# Patient Record
Sex: Male | Born: 1955 | Race: Black or African American | Hispanic: No | Marital: Married | Smoking: Current every day smoker
Health system: Southern US, Community
[De-identification: ages and names within clinical notes are randomized; demographics above are authoritative.]

## PROBLEM LIST (undated history)

## (undated) DIAGNOSIS — K219 Gastro-esophageal reflux disease without esophagitis: Secondary | ICD-10-CM

## (undated) DIAGNOSIS — I10 Essential (primary) hypertension: Secondary | ICD-10-CM

## (undated) HISTORY — PX: KNEE SURGERY: SHX244

---

## 2006-05-11 ENCOUNTER — Emergency Department: Payer: Self-pay | Admitting: Emergency Medicine

## 2011-02-06 ENCOUNTER — Emergency Department (HOSPITAL_COMMUNITY)
Admission: EM | Admit: 2011-02-06 | Discharge: 2011-02-06 | Disposition: A | Discharge status: Home / Self Care | Payer: No Typology Code available for payment source | Attending: Emergency Medicine | Admitting: Emergency Medicine

## 2011-02-06 ENCOUNTER — Encounter (HOSPITAL_COMMUNITY): Payer: Self-pay | Admitting: *Deleted

## 2011-02-06 DIAGNOSIS — S8001XA Contusion of right knee, initial encounter: Secondary | ICD-10-CM

## 2011-02-06 DIAGNOSIS — M810 Age-related osteoporosis without current pathological fracture: Secondary | ICD-10-CM | POA: Insufficient documentation

## 2011-02-06 DIAGNOSIS — S8000XA Contusion of unspecified knee, initial encounter: Secondary | ICD-10-CM | POA: Insufficient documentation

## 2011-02-06 DIAGNOSIS — K219 Gastro-esophageal reflux disease without esophagitis: Secondary | ICD-10-CM | POA: Insufficient documentation

## 2011-02-06 DIAGNOSIS — I10 Essential (primary) hypertension: Secondary | ICD-10-CM | POA: Insufficient documentation

## 2011-02-06 DIAGNOSIS — M25569 Pain in unspecified knee: Secondary | ICD-10-CM | POA: Insufficient documentation

## 2011-02-06 HISTORY — DX: Gastro-esophageal reflux disease without esophagitis: K21.9

## 2011-02-06 HISTORY — DX: Essential (primary) hypertension: I10

## 2011-02-06 MED ORDER — IBUPROFEN 800 MG PO TABS: 800.0000 mg | ORAL_TABLET | Freq: Four times a day (QID) | ORAL | Status: AC | PRN
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
## 2011-02-07 NOTE — ED Provider Notes (Signed)
Medical screening examination/treatment/procedure(s) were performed by non-physician practitioner and as supervising physician I was immediately available for consultation/collaboration.  Flint Melter, MD 02/07/11 1029

## 2017-12-09 DIAGNOSIS — S83104A Unspecified dislocation of right knee, initial encounter: Secondary | ICD-10-CM | POA: Insufficient documentation

## 2017-12-09 DIAGNOSIS — S83241A Other tear of medial meniscus, current injury, right knee, initial encounter: Secondary | ICD-10-CM | POA: Insufficient documentation

## 2018-09-14 DIAGNOSIS — N401 Enlarged prostate with lower urinary tract symptoms: Secondary | ICD-10-CM | POA: Insufficient documentation

## 2018-09-14 DIAGNOSIS — R3129 Other microscopic hematuria: Secondary | ICD-10-CM | POA: Insufficient documentation

## 2019-02-24 ENCOUNTER — Emergency Department
Admission: EM | Admit: 2019-02-24 | Discharge: 2019-02-24 | Disposition: A | Discharge status: Home / Self Care | Attending: Emergency Medicine | Admitting: Emergency Medicine

## 2019-02-24 ENCOUNTER — Other Ambulatory Visit: Payer: Self-pay

## 2019-02-24 ENCOUNTER — Encounter: Payer: Self-pay | Admitting: Emergency Medicine

## 2019-02-24 ENCOUNTER — Emergency Department

## 2019-02-24 DIAGNOSIS — R0602 Shortness of breath: Secondary | ICD-10-CM | POA: Diagnosis not present

## 2019-02-24 DIAGNOSIS — I10 Essential (primary) hypertension: Secondary | ICD-10-CM | POA: Diagnosis not present

## 2019-02-24 DIAGNOSIS — R0789 Other chest pain: Secondary | ICD-10-CM | POA: Diagnosis not present

## 2019-02-24 DIAGNOSIS — Z886 Allergy status to analgesic agent status: Secondary | ICD-10-CM | POA: Insufficient documentation

## 2019-02-24 DIAGNOSIS — R079 Chest pain, unspecified: Secondary | ICD-10-CM

## 2019-02-24 DIAGNOSIS — Z79899 Other long term (current) drug therapy: Secondary | ICD-10-CM | POA: Insufficient documentation

## 2019-02-24 DIAGNOSIS — F17210 Nicotine dependence, cigarettes, uncomplicated: Secondary | ICD-10-CM | POA: Insufficient documentation

## 2019-02-24 LAB — CBC WITH DIFFERENTIAL/PLATELET
Abs Immature Granulocytes: 0.04 10*3/uL (ref 0.00–0.07)
Basophils Absolute: 0.1 10*3/uL (ref 0.0–0.1)
Basophils Relative: 1 %
Eosinophils Absolute: 0.1 10*3/uL (ref 0.0–0.5)
Eosinophils Relative: 1 %
HCT: 44.5 % (ref 39.0–52.0)
Hemoglobin: 14.9 g/dL (ref 13.0–17.0)
Immature Granulocytes: 1 %
Lymphocytes Relative: 25 %
Lymphs Abs: 2 10*3/uL (ref 0.7–4.0)
MCH: 29.2 pg (ref 26.0–34.0)
MCHC: 33.5 g/dL (ref 30.0–36.0)
MCV: 87.1 fL (ref 80.0–100.0)
Monocytes Absolute: 0.5 10*3/uL (ref 0.1–1.0)
Monocytes Relative: 6 %
Neutro Abs: 5.4 10*3/uL (ref 1.7–7.7)
Neutrophils Relative %: 66 %
Platelets: 285 10*3/uL (ref 150–400)
RBC: 5.11 MIL/uL (ref 4.22–5.81)
RDW: 14.8 % (ref 11.5–15.5)
WBC: 8 10*3/uL (ref 4.0–10.5)
nRBC: 0 % (ref 0.0–0.2)

## 2019-02-24 LAB — BASIC METABOLIC PANEL
Anion gap: 11 (ref 5–15)
BUN: 24 mg/dL — ABNORMAL HIGH (ref 8–23)
CO2: 23 mmol/L (ref 22–32)
Calcium: 8.8 mg/dL — ABNORMAL LOW (ref 8.9–10.3)
Chloride: 105 mmol/L (ref 98–111)
Creatinine, Ser: 1.12 mg/dL (ref 0.61–1.24)
GFR calc Af Amer: >60 mL/min (ref >60)
GFR calc non Af Amer: >60 mL/min (ref >60)
Glucose, Bld: 257 mg/dL — ABNORMAL HIGH (ref 70–99)
Potassium: 4 mmol/L (ref 3.5–5.1)
Sodium: 139 mmol/L (ref 135–145)

## 2019-02-24 LAB — BRAIN NATRIURETIC PEPTIDE: B Natriuretic Peptide: 28 pg/mL (ref 0.0–100.0)

## 2019-02-24 LAB — TROPONIN I (HIGH SENSITIVITY)
Troponin I (High Sensitivity): 4 ng/L (ref <18)
Troponin I (High Sensitivity): 5 ng/L (ref <18)

## 2019-02-24 IMAGING — CT CT ANGIO CHEST
2 of 6 series · 19 of 46 positions shown · IV contrast (APPLIED)
Comparison: [DATE]

CLINICAL DATA: Shortness of breath, chest pain

EXAM:
CT ANGIOGRAPHY CHEST WITH CONTRAST
TECHNIQUE: Multidetector CT imaging of the chest was performed using the
standard protocol during bolus administration of intravenous
contrast. Multiplanar CT image reconstructions and MIPs were
obtained to evaluate the vascular anatomy.
CONTRAST:  75mL OMNIPAQUE IOHEXOL 350 MG/ML SOLN

[Series 5: thins · axial · 0.65mm/px · z∈[-343,-70]mm · 16 of 300 slices shown]
[im 14/300  lung]
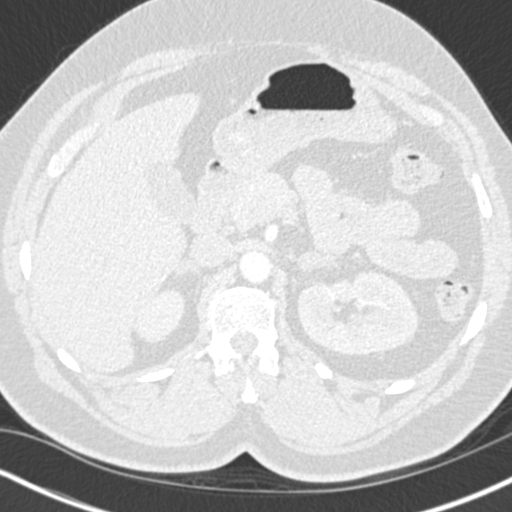
[im 40/300  soft-tissue]
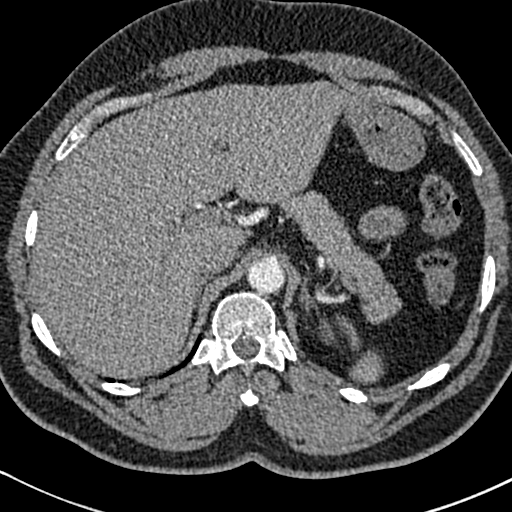
[im 53/300  lung]
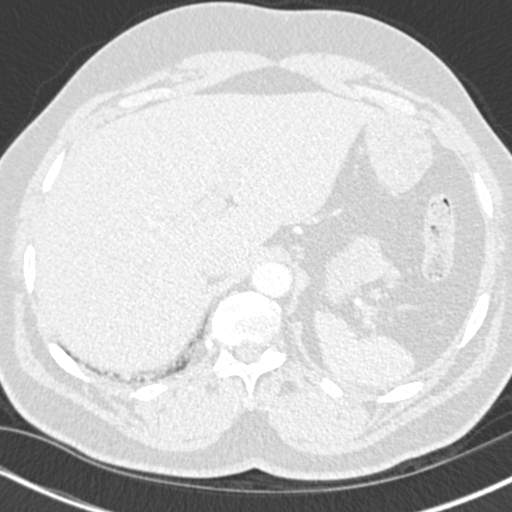
[im 66/300  soft-tissue]
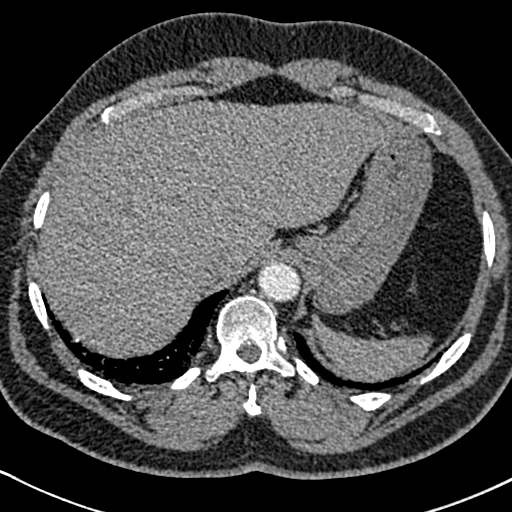
[im 92/300  lung]
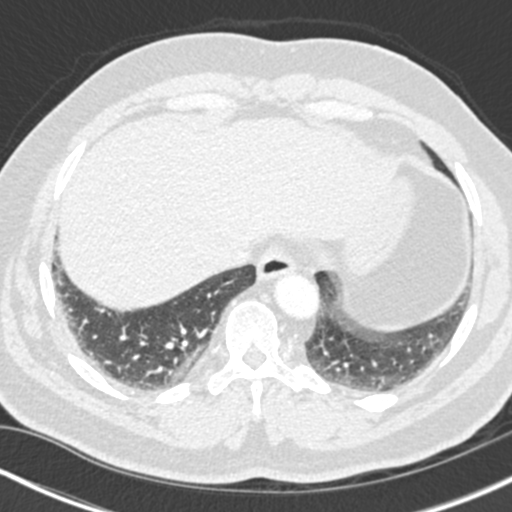
[im 105/300  soft-tissue]
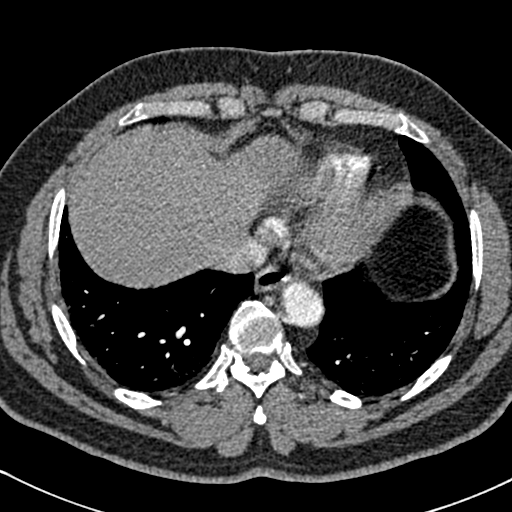
[im 118/300  lung]
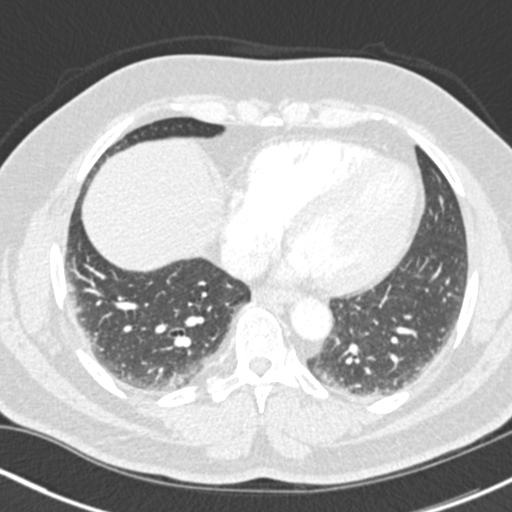
[im 144/300  soft-tissue]
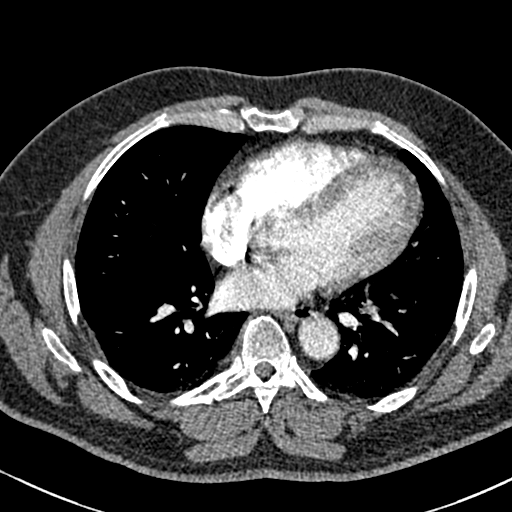
[im 157/300  lung]
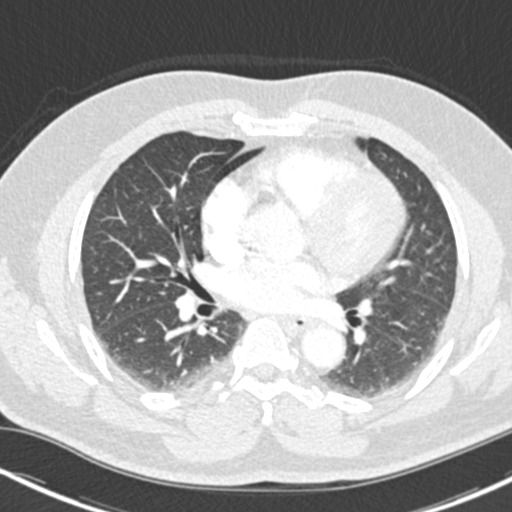
[im 183/300  soft-tissue]
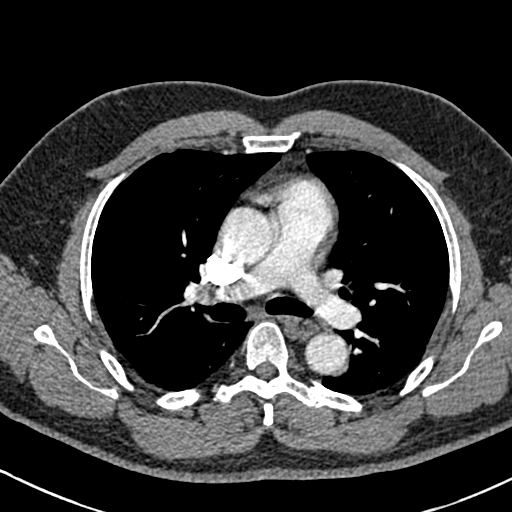
[im 196/300  lung]
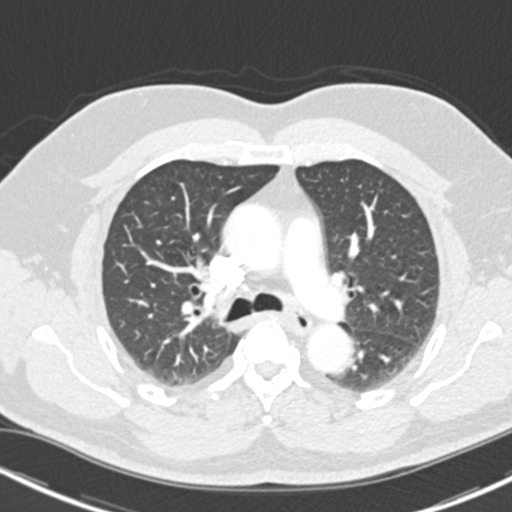
[im 209/300  soft-tissue]
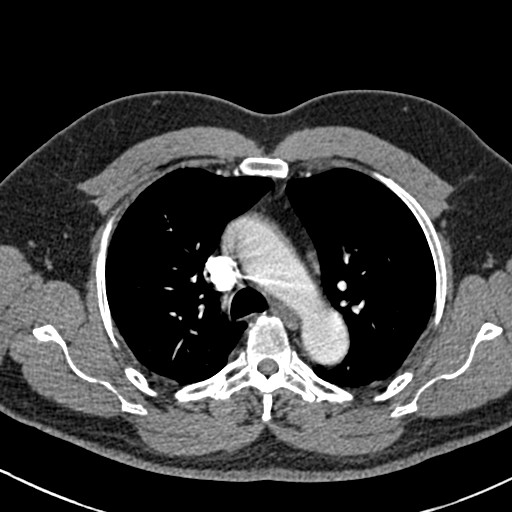
[im 235/300  lung]
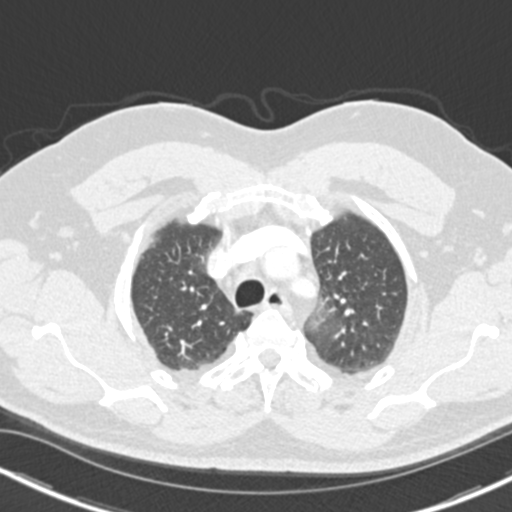
[im 248/300  soft-tissue]
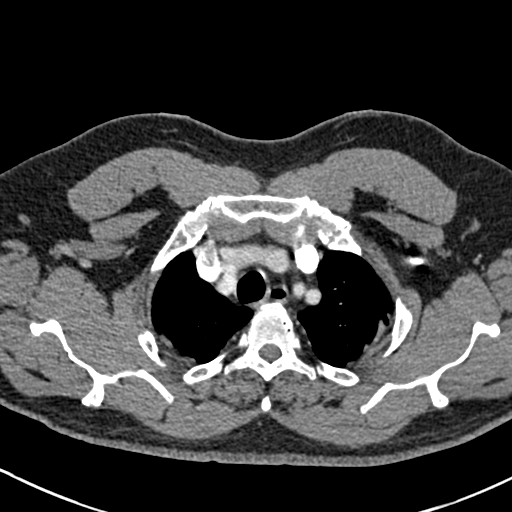
[im 261/300  lung]
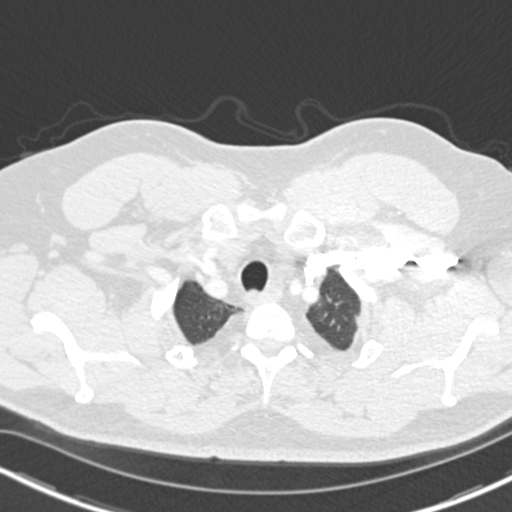
[im 287/300  soft-tissue]
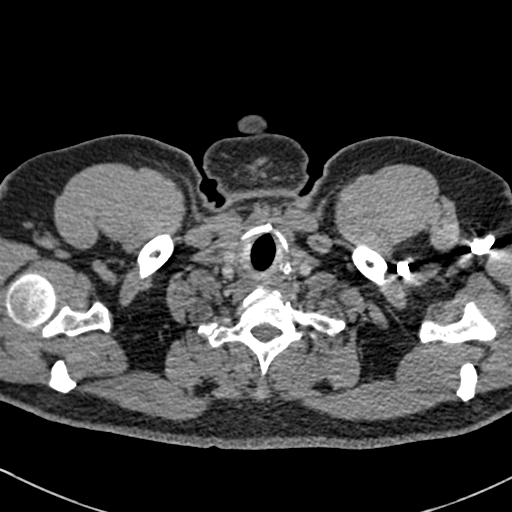

[Series 7: coronal mpr · coronal · 0.64mm/px · 3 of 96 slices shown]
[im 24/96  soft-tissue]
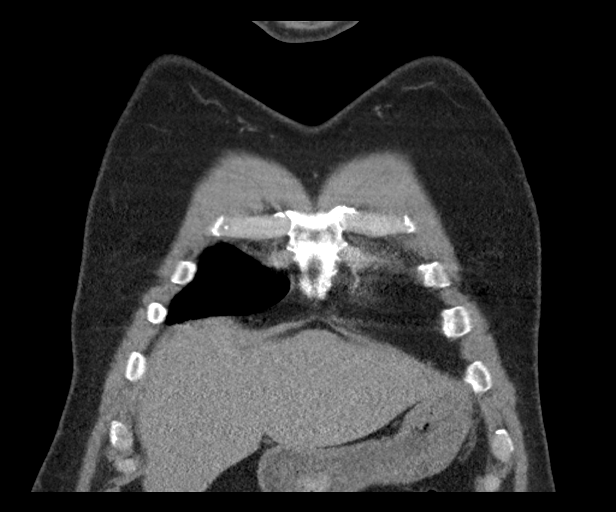
[im 48/96  soft-tissue]
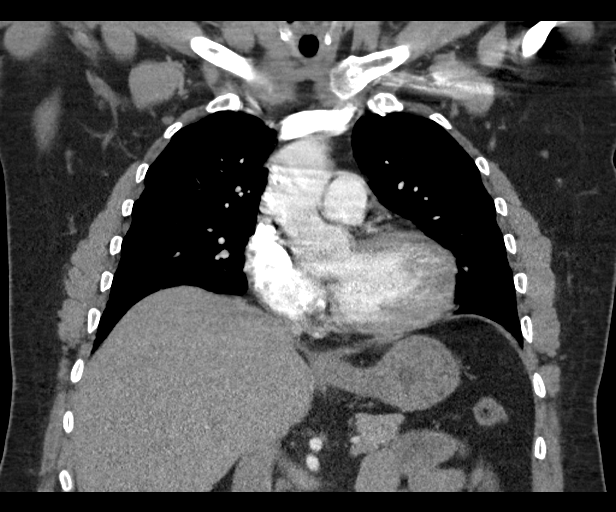
[im 72/96  soft-tissue]
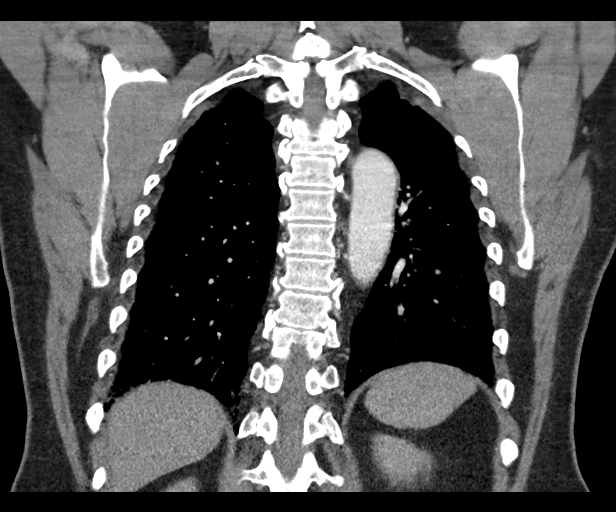

[19 of 46 positions shown; findings below may reference images not displayed]

FINDINGS: Cardiovascular: Satisfactory opacification of the pulmonary arteries
to the segmental level. No evidence of pulmonary embolism. Normal
heart size. No pericardial effusion.

Mediastinum/Nodes: No enlarged mediastinal, hilar, or axillary lymph
nodes. Thyroid gland, trachea, and esophagus demonstrate no
significant findings.

Lungs/Pleura: Lungs are clear. No pleural effusion or pneumothorax.

Upper Abdomen: No acute abnormality.

Musculoskeletal: No chest wall abnormality. No acute or significant
osseous findings.

Review of the MIP images confirms the above findings.
IMPRESSION: Negative examination for pulmonary embolism.

## 2019-02-24 MED ORDER — IOHEXOL 350 MG/ML SOLN
75.0000 mL | Freq: Once | INTRAVENOUS | Status: AC | PRN
  Administered 2019-02-24: 09:00:00 75 mL via INTRAVENOUS

## 2019-02-24 MED ORDER — OMEPRAZOLE 20 MG PO CPDR: 20.0000 mg | DELAYED_RELEASE_CAPSULE | Freq: Every day | ORAL | 1 refills | Status: DC

## 2019-02-24 NOTE — ED Triage Notes (Signed)
C/O chest pain.  Describes pain to center of chest and right chest.  States pain worse with moving right arm.  Had stress test on 1/26.  States since stress test, pain has worsened.

## 2019-02-24 NOTE — ED Provider Notes (Signed)
[facility] Emergency Department Provider Note   ____________________________________________   None    (approximate)  I have reviewed the triage vital signs and the nursing notes.   HISTORY  Chief Complaint Chest Pain    HPI Frank Wise Wilfred Curtis is a 64 y.o. male with past history of hypertension and GERD who presents to the ED complaining of chest pain.  Patient reports that he has been dealing with pain in the center of his chest off and on for about the past 10 years.  He describes it as sharp and not exacerbated or alleviated by anything.  It has become more frequent recently and started up again this morning around 3 AM.  He describes some mild shortness of breath when going upstairs, but has not noticed any fevers, cough, pain or swelling in his legs.  He states he has been following with his PCP at the [facility] for this problem and had a stress test performed 3 days ago.  He called his doctor's office yesterday and was told over the phone that the results of the stress test were normal.  He has been referred to cardiology, but has not yet been seen by them.  He states he was told by his doctor that he "might have an embolus".  He describes pain as mild currently, has not taken anything for it yet today.        Past Medical History:  Diagnosis Date  . Acid reflux   . Hypertension   . Osteoporosis     There are no problems to display for this patient.   Past Surgical History:  Procedure Laterality Date  . KNEE SURGERY     Right    Prior to Admission medications   Medication Sig Start Date End Date Taking? Authorizing Provider  atenolol (TENORMIN) 25 MG tablet Take 25 mg by mouth daily.    [provider]  citalopram (CELEXA) 20 MG tablet Take 20 mg by mouth daily.    [provider]  omeprazole (PRILOSEC) 20 MG capsule Take 1 capsule (20 mg total) by mouth daily. 02/24/19 04/25/19  Blake Divine, MD    Allergies Celebrex  [celecoxib]  Family History  Problem Relation Age of Onset  . Hypertension Father   . Diabetes Sister     Social History Social History   Tobacco Use  . Smoking status: Current Every Day Smoker    Packs/day: 1.00  . Smokeless tobacco: Never Used  Substance Use Topics  . Alcohol use: No  . Drug use: No    Review of Systems  Constitutional: No fever/chills Eyes: No visual changes. ENT: No sore throat. Cardiovascular: Positive for chest pain. Respiratory: Positive for shortness of breath. Gastrointestinal: No abdominal pain.  No nausea, no vomiting.  No diarrhea.  No constipation. Genitourinary: Negative for dysuria. Musculoskeletal: Negative for back pain. Skin: Negative for rash. Neurological: Negative for headaches, focal weakness or numbness.  ____________________________________________   PHYSICAL EXAM:  VITAL SIGNS: ED Triage Vitals  Enc Vitals Group     BP 02/24/19 0758 (!) 153/86     Pulse Rate 02/24/19 0758 94     Resp 02/24/19 0758 (!) 22     Temp 02/24/19 0758 98.6 F (37 C)     Temp Source 02/24/19 0758 Oral     SpO2 02/24/19 0758 95 %     Weight 02/24/19 0751 192 lb 0.3 oz (87.1 kg)     Height --  Head Circumference --      Peak Flow --      Pain Score 02/24/19 0750 4     Pain Loc --      Pain Edu? --      Excl. in [place]? --     Constitutional: Alert and oriented. Eyes: Conjunctivae are normal. Head: Atraumatic. Nose: No congestion/rhinnorhea. Mouth/Throat: Mucous membranes are moist. Neck: Normal ROM Cardiovascular: Normal rate, regular rhythm. Grossly normal heart sounds. Respiratory: Normal respiratory effort.  No retractions. Lungs CTAB.  No chest wall tenderness. Gastrointestinal: Soft and nontender. No distention. Genitourinary: deferred Musculoskeletal: No lower extremity tenderness nor edema. Neurologic:  Normal speech and language. No gross focal neurologic deficits are appreciated. Skin:  Skin is warm, dry and intact. No rash  noted. Psychiatric: Mood and affect are normal. Speech and behavior are normal.  ____________________________________________   LABS (all labs ordered are listed, but only abnormal results are displayed)  Labs Reviewed  BASIC METABOLIC PANEL - Abnormal; Notable for the following components:      Result Value   Glucose, Bld 257 (*)    BUN 24 (*)    Calcium 8.8 (*)    All other components within normal limits  CBC WITH DIFFERENTIAL/PLATELET  BRAIN NATRIURETIC PEPTIDE  TROPONIN I (HIGH SENSITIVITY)  TROPONIN I (HIGH SENSITIVITY)   ____________________________________________  EKG  ED ECG REPORT I, Blake Divine, the attending physician, personally viewed and interpreted this ECG.   Date: 02/24/2019  EKG Time: 7:55  Rate: 96  Rhythm: normal sinus rhythm  Axis: Normal  Intervals:none  ST&T Change: None   PROCEDURES  Procedure(s) performed (including Critical Care):  Procedures   ____________________________________________   INITIAL IMPRESSION / ASSESSMENT AND PLAN / ED COURSE       64 year old male with history of hypertension and GERD presents to the ED complaining of intermittent chest pain for years, acutely worse since early this morning.  He did have a stress test earlier this week that he reports is negative and symptoms sound very atypical for ACS.  Initial EKG without acute ischemic changes, will check 2 sets of troponin and if this is negative I doubt ACS.  We will also assess for PE with CTA of chest.  Doubt dissection and no clinical signs of heart failure.  If work-up is unremarkable, suspect musculoskeletal etiology versus GERD and patient would be appropriate for outpatient follow-up with his PCP as well as cardiology.  CTA negative for PE or other acute process, 2 sets of troponin within normal limits.  Will start patient on omeprazole for potential GERD and I have counseled him to follow-up with his PCP at the [facility] as well as cardiology as previously  scheduled.  Counseled him to return to the ED for new or worsening symptoms, patient agrees with plan.      ____________________________________________   FINAL CLINICAL IMPRESSION(S) / ED DIAGNOSES  Final diagnoses:  Chest pain, unspecified type     ED Discharge Orders         Ordered    omeprazole (PRILOSEC) 20 MG capsule  Daily     02/24/19 1144           Note:  This document was prepared using Dragon voice recognition software and may include unintentional dictation errors.   Blake Divine, MD 02/24/19 [phone]

## 2020-09-04 DIAGNOSIS — G4719 Other hypersomnia: Secondary | ICD-10-CM | POA: Insufficient documentation

## 2020-10-21 ENCOUNTER — Other Ambulatory Visit: Payer: Self-pay

## 2020-10-21 ENCOUNTER — Encounter: Payer: Self-pay | Admitting: Podiatry

## 2020-10-21 ENCOUNTER — Ambulatory Visit (INDEPENDENT_AMBULATORY_CARE_PROVIDER_SITE_OTHER): Admitting: Podiatry

## 2020-10-21 DIAGNOSIS — R519 Headache, unspecified: Secondary | ICD-10-CM | POA: Insufficient documentation

## 2020-10-21 DIAGNOSIS — M79676 Pain in unspecified toe(s): Secondary | ICD-10-CM | POA: Diagnosis not present

## 2020-10-21 DIAGNOSIS — M2550 Pain in unspecified joint: Secondary | ICD-10-CM | POA: Insufficient documentation

## 2020-10-21 DIAGNOSIS — F331 Major depressive disorder, recurrent, moderate: Secondary | ICD-10-CM | POA: Insufficient documentation

## 2020-10-21 DIAGNOSIS — K635 Polyp of colon: Secondary | ICD-10-CM | POA: Insufficient documentation

## 2020-10-21 DIAGNOSIS — D2370 Other benign neoplasm of skin of unspecified lower limb, including hip: Secondary | ICD-10-CM

## 2020-10-21 DIAGNOSIS — R0789 Other chest pain: Secondary | ICD-10-CM | POA: Insufficient documentation

## 2020-10-21 DIAGNOSIS — G4730 Sleep apnea, unspecified: Secondary | ICD-10-CM | POA: Insufficient documentation

## 2020-10-21 DIAGNOSIS — G4714 Hypersomnia due to medical condition: Secondary | ICD-10-CM | POA: Insufficient documentation

## 2020-10-21 DIAGNOSIS — F329 Major depressive disorder, single episode, unspecified: Secondary | ICD-10-CM | POA: Insufficient documentation

## 2020-10-21 DIAGNOSIS — G3184 Mild cognitive impairment, so stated: Secondary | ICD-10-CM | POA: Insufficient documentation

## 2020-10-21 DIAGNOSIS — H409 Unspecified glaucoma: Secondary | ICD-10-CM | POA: Insufficient documentation

## 2020-10-21 DIAGNOSIS — Z860100 Personal history of colon polyps, unspecified: Secondary | ICD-10-CM | POA: Insufficient documentation

## 2020-10-21 DIAGNOSIS — R69 Illness, unspecified: Secondary | ICD-10-CM | POA: Insufficient documentation

## 2020-10-21 DIAGNOSIS — F321 Major depressive disorder, single episode, moderate: Secondary | ICD-10-CM | POA: Insufficient documentation

## 2020-10-21 DIAGNOSIS — H903 Sensorineural hearing loss, bilateral: Secondary | ICD-10-CM | POA: Insufficient documentation

## 2020-10-21 DIAGNOSIS — F4321 Adjustment disorder with depressed mood: Secondary | ICD-10-CM | POA: Insufficient documentation

## 2020-10-21 DIAGNOSIS — Z01818 Encounter for other preprocedural examination: Secondary | ICD-10-CM | POA: Insufficient documentation

## 2020-10-21 DIAGNOSIS — B351 Tinea unguium: Secondary | ICD-10-CM | POA: Diagnosis not present

## 2020-10-21 DIAGNOSIS — L603 Nail dystrophy: Secondary | ICD-10-CM | POA: Diagnosis not present

## 2020-10-21 DIAGNOSIS — F32A Depression, unspecified: Secondary | ICD-10-CM | POA: Insufficient documentation

## 2020-10-21 DIAGNOSIS — E119 Type 2 diabetes mellitus without complications: Secondary | ICD-10-CM | POA: Insufficient documentation

## 2020-10-21 DIAGNOSIS — M79643 Pain in unspecified hand: Secondary | ICD-10-CM | POA: Insufficient documentation

## 2020-10-21 DIAGNOSIS — R1013 Epigastric pain: Secondary | ICD-10-CM | POA: Insufficient documentation

## 2020-10-21 DIAGNOSIS — K219 Gastro-esophageal reflux disease without esophagitis: Secondary | ICD-10-CM | POA: Insufficient documentation

## 2020-10-21 DIAGNOSIS — L409 Psoriasis, unspecified: Secondary | ICD-10-CM | POA: Insufficient documentation

## 2020-10-21 DIAGNOSIS — K2930 Chronic superficial gastritis without bleeding: Secondary | ICD-10-CM | POA: Insufficient documentation

## 2020-10-21 DIAGNOSIS — M19042 Primary osteoarthritis, left hand: Secondary | ICD-10-CM | POA: Insufficient documentation

## 2020-10-21 DIAGNOSIS — N399 Disorder of urinary system, unspecified: Secondary | ICD-10-CM | POA: Insufficient documentation

## 2020-10-21 DIAGNOSIS — H401234 Low-tension glaucoma, bilateral, indeterminate stage: Secondary | ICD-10-CM | POA: Insufficient documentation

## 2020-10-21 DIAGNOSIS — G4700 Insomnia, unspecified: Secondary | ICD-10-CM | POA: Insufficient documentation

## 2020-10-21 DIAGNOSIS — H9313 Tinnitus, bilateral: Secondary | ICD-10-CM | POA: Insufficient documentation

## 2020-10-21 DIAGNOSIS — M25569 Pain in unspecified knee: Secondary | ICD-10-CM | POA: Insufficient documentation

## 2020-10-21 DIAGNOSIS — M1990 Unspecified osteoarthritis, unspecified site: Secondary | ICD-10-CM | POA: Insufficient documentation

## 2020-10-21 DIAGNOSIS — Z716 Tobacco abuse counseling: Secondary | ICD-10-CM | POA: Insufficient documentation

## 2020-10-21 DIAGNOSIS — Z9189 Other specified personal risk factors, not elsewhere classified: Secondary | ICD-10-CM | POA: Insufficient documentation

## 2020-10-21 DIAGNOSIS — N529 Male erectile dysfunction, unspecified: Secondary | ICD-10-CM | POA: Insufficient documentation

## 2020-10-21 DIAGNOSIS — E785 Hyperlipidemia, unspecified: Secondary | ICD-10-CM | POA: Insufficient documentation

## 2020-10-21 DIAGNOSIS — L810 Postinflammatory hyperpigmentation: Secondary | ICD-10-CM | POA: Insufficient documentation

## 2020-10-21 DIAGNOSIS — Z461 Encounter for fitting and adjustment of hearing aid: Secondary | ICD-10-CM | POA: Insufficient documentation

## 2020-10-21 DIAGNOSIS — F17200 Nicotine dependence, unspecified, uncomplicated: Secondary | ICD-10-CM | POA: Insufficient documentation

## 2020-10-21 DIAGNOSIS — I10 Essential (primary) hypertension: Secondary | ICD-10-CM | POA: Insufficient documentation

## 2020-10-21 NOTE — Progress Notes (Signed)
Subjective:  Patient ID: Frank Wise, male    DOB: [birth year],  MRN: [id number] HPI Chief Complaint  Patient presents with   Nail Problem    Toenails bilateral - thick and discolored x years, has trouble trimming himself   New Patient (Initial Visit)    65 y.o. male presents with the above complaint.   ROS: Denies fever chills nausea vomiting muscle aches pains calf pain back pain chest pain shortness of breath.  Past Medical History:  Diagnosis Date   Acid reflux    Hypertension    Osteoporosis    Past Surgical History:  Procedure Laterality Date   KNEE SURGERY     Right    Current Outpatient Medications:    aspirin EC 81 MG tablet, Take 81 mg by mouth daily. Swallow whole., Disp: , Rfl:    eszopiclone (LUNESTA) 2 MG TABS tablet, Take 1 tablet by mouth every evening., Disp: , Rfl:    latanoprost (XALATAN) 0.005 % ophthalmic solution, 1 drop at bedtime., Disp: , Rfl:    amLODipine (NORVASC) 10 MG tablet, Take 10 mg by mouth daily., Disp: , Rfl:    atenolol (TENORMIN) 25 MG tablet, Take 25 mg by mouth daily., Disp: , Rfl:    atorvastatin (LIPITOR) 80 MG tablet, , Disp: , Rfl:    buPROPion ER (WELLBUTRIN SR) 100 MG 12 hr tablet, Take 100 mg by mouth 2 (two) times daily., Disp: , Rfl:    lisinopril (ZESTRIL) 20 MG tablet, Take 20 mg by mouth daily., Disp: , Rfl:    loratadine (CLARITIN) 10 MG tablet, Take 10 mg by mouth daily., Disp: , Rfl:    omeprazole (PRILOSEC) 40 MG capsule, Take 40 mg by mouth daily., Disp: , Rfl:    potassium chloride SA (KLOR-CON) 20 MEQ tablet, Take 20 mEq by mouth 2 (two) times daily., Disp: , Rfl:    sertraline (ZOLOFT) 100 MG tablet, Take 100 mg by mouth daily., Disp: , Rfl:    sucralfate (CARAFATE) 1 g tablet, Take 1 g by mouth 2 (two) times daily., Disp: , Rfl:    vardenafil (LEVITRA) 20 MG tablet, Take by mouth., Disp: , Rfl:   Allergies  Allergen Reactions   Celebrex [Celecoxib] Palpitations   Review of Systems Objective:  There were  no vitals filed for this visit.  General: Well developed, nourished, in no acute distress, alert and oriented x3   Dermatological: Skin is warm, dry and supple bilateral. Nails x 10 thick yellow dystrophic clinically mycotic remaining integument appears unremarkable at this time. There are no open sores, no preulcerative lesions, no rash or signs of infection present.  Vascular: Dorsalis Pedis artery and Posterior Tibial artery pedal pulses are 2/4 bilateral with immedate capillary fill time. Pedal hair growth present. No varicosities and no lower extremity edema present bilateral.   Neruologic: Grossly intact via light touch bilateral. Vibratory intact via tuning fork bilateral. Protective threshold with Semmes Wienstein monofilament intact to all pedal sites bilateral. Patellar and Achilles deep tendon reflexes 2+ bilateral. No Babinski or clonus noted bilateral.   Musculoskeletal: No gross boney pedal deformities bilateral. No pain, crepitus, or limitation noted with foot and ankle range of motion bilateral. Muscular strength 5/5 in all groups tested bilateral.  Gait: Unassisted, Nonantalgic.    Radiographs:  None taken  Assessment & Plan:   Assessment: Nail dystrophy bilateral 1 through 10 toes  Plan: Debridement and sampling of the nails today 1 through 5 bilaterally we will send these nail samples  to pathology for evaluation.  Discussed the possible need for medication with him.     Tonae Livolsi T. [place], [place]

## 2020-11-18 ENCOUNTER — Other Ambulatory Visit: Payer: Self-pay

## 2020-11-18 ENCOUNTER — Ambulatory Visit (INDEPENDENT_AMBULATORY_CARE_PROVIDER_SITE_OTHER): Admitting: Podiatry

## 2020-11-18 ENCOUNTER — Encounter: Payer: Self-pay | Admitting: Podiatry

## 2020-11-18 DIAGNOSIS — Z79899 Other long term (current) drug therapy: Secondary | ICD-10-CM

## 2020-11-18 DIAGNOSIS — L603 Nail dystrophy: Secondary | ICD-10-CM

## 2020-11-18 NOTE — Progress Notes (Signed)
He presents today for his pathology results regarding his toenails.  Objective: Vital signs are stable alert oriented x3 there is no erythema edema cellulitis drainage or odor no change in physical exam.  Pathology does demonstrate onychomycosis Trichophyton rubrum.  Assessment: Trichophyton rubrum.  Plan: Discussed etiology pathology conservative versus surgical therapies at this point we discussed the pros and cons of topical therapy laser therapy and oral therapy.  Currently he would like to try laser therapy he understands the this and is amenable to it understands that it is between 80 and 85% effective and he will start this in mid November.

## 2020-12-13 ENCOUNTER — Other Ambulatory Visit: Payer: TRICARE For Life (TFL)

## 2020-12-26 DEATH — deceased
